# Patient Record
Sex: Female | Born: 2000
Health system: Southern US, Community
[De-identification: ages and names within clinical notes are randomized; demographics above are authoritative.]

---
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
## 2019-10-06 ENCOUNTER — Encounter (HOSPITAL_COMMUNITY): Payer: Self-pay | Admitting: Pharmacy Technician

## 2019-10-06 ENCOUNTER — Emergency Department (HOSPITAL_COMMUNITY)
Admission: EM | Admit: 2019-10-06 | Discharge: 2019-10-06 | Disposition: A | Discharge status: Home / Self Care | Payer: BC Managed Care – PPO | Attending: Emergency Medicine | Admitting: Emergency Medicine

## 2019-10-06 ENCOUNTER — Emergency Department (HOSPITAL_COMMUNITY): Payer: BC Managed Care – PPO

## 2019-10-06 ENCOUNTER — Other Ambulatory Visit: Payer: Self-pay

## 2019-10-06 DIAGNOSIS — S0101XA Laceration without foreign body of scalp, initial encounter: Secondary | ICD-10-CM | POA: Insufficient documentation

## 2019-10-06 DIAGNOSIS — Y92410 Unspecified street and highway as the place of occurrence of the external cause: Secondary | ICD-10-CM | POA: Insufficient documentation

## 2019-10-06 DIAGNOSIS — Y999 Unspecified external cause status: Secondary | ICD-10-CM | POA: Insufficient documentation

## 2019-10-06 DIAGNOSIS — Y9389 Activity, other specified: Secondary | ICD-10-CM | POA: Insufficient documentation

## 2019-10-06 DIAGNOSIS — Z23 Encounter for immunization: Secondary | ICD-10-CM | POA: Diagnosis not present

## 2019-10-06 DIAGNOSIS — R42 Dizziness and giddiness: Secondary | ICD-10-CM | POA: Insufficient documentation

## 2019-10-06 DIAGNOSIS — R519 Headache, unspecified: Secondary | ICD-10-CM | POA: Diagnosis not present

## 2019-10-06 LAB — I-STAT BETA HCG BLOOD, ED (MC, WL, AP ONLY): I-stat hCG, quantitative: <5 m[IU]/mL (ref <5)

## 2019-10-06 MED ORDER — TETANUS-DIPHTH-ACELL PERTUSSIS 5-2.5-18.5 LF-MCG/0.5 IM SUSP
0.5000 mL | Freq: Once | INTRAMUSCULAR | Status: AC
  Administered 2019-10-06: 0.5 mL via INTRAMUSCULAR
  Filled 2019-10-06: qty 0.5

## 2019-10-06 MED ORDER — MORPHINE SULFATE (PF) 2 MG/ML IV SOLN
2.0000 mg | Freq: Once | INTRAVENOUS | Status: AC
  Administered 2019-10-06: 19:00:00 2 mg via INTRAVENOUS
  Filled 2019-10-06: qty 1

## 2019-10-06 MED ORDER — LIDOCAINE-EPINEPHRINE-TETRACAINE (LET) TOPICAL GEL
3.0000 mL | Freq: Once | TOPICAL | Status: AC
  Administered 2019-10-06: 19:00:00 3 mL via TOPICAL
  Filled 2019-10-06: qty 3

## 2019-10-06 NOTE — Discharge Instructions (Addendum)
Per our discussion, please follow-up with your primary care provider or the emergency department for removal of your staples in 1 week.  Please do not hesitate to return to the emergency department with any new or worsening symptoms.  I would recommend applying Neosporin or bacitracin to the wound on her head once to twice a week.  Please be careful when showering and not aggressively scrubbing the region or drying the region.  It was a pleasure to meet you.
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
## 2020-06-16 IMAGING — DX PORTABLE CHEST - 1 VIEW
1 series · 1 of 1 positions shown · non-contrast
Comparison: None.

CLINICAL DATA: 17-year-old female with fever.

EXAM:
PORTABLE CHEST 1 VIEW

[chest]
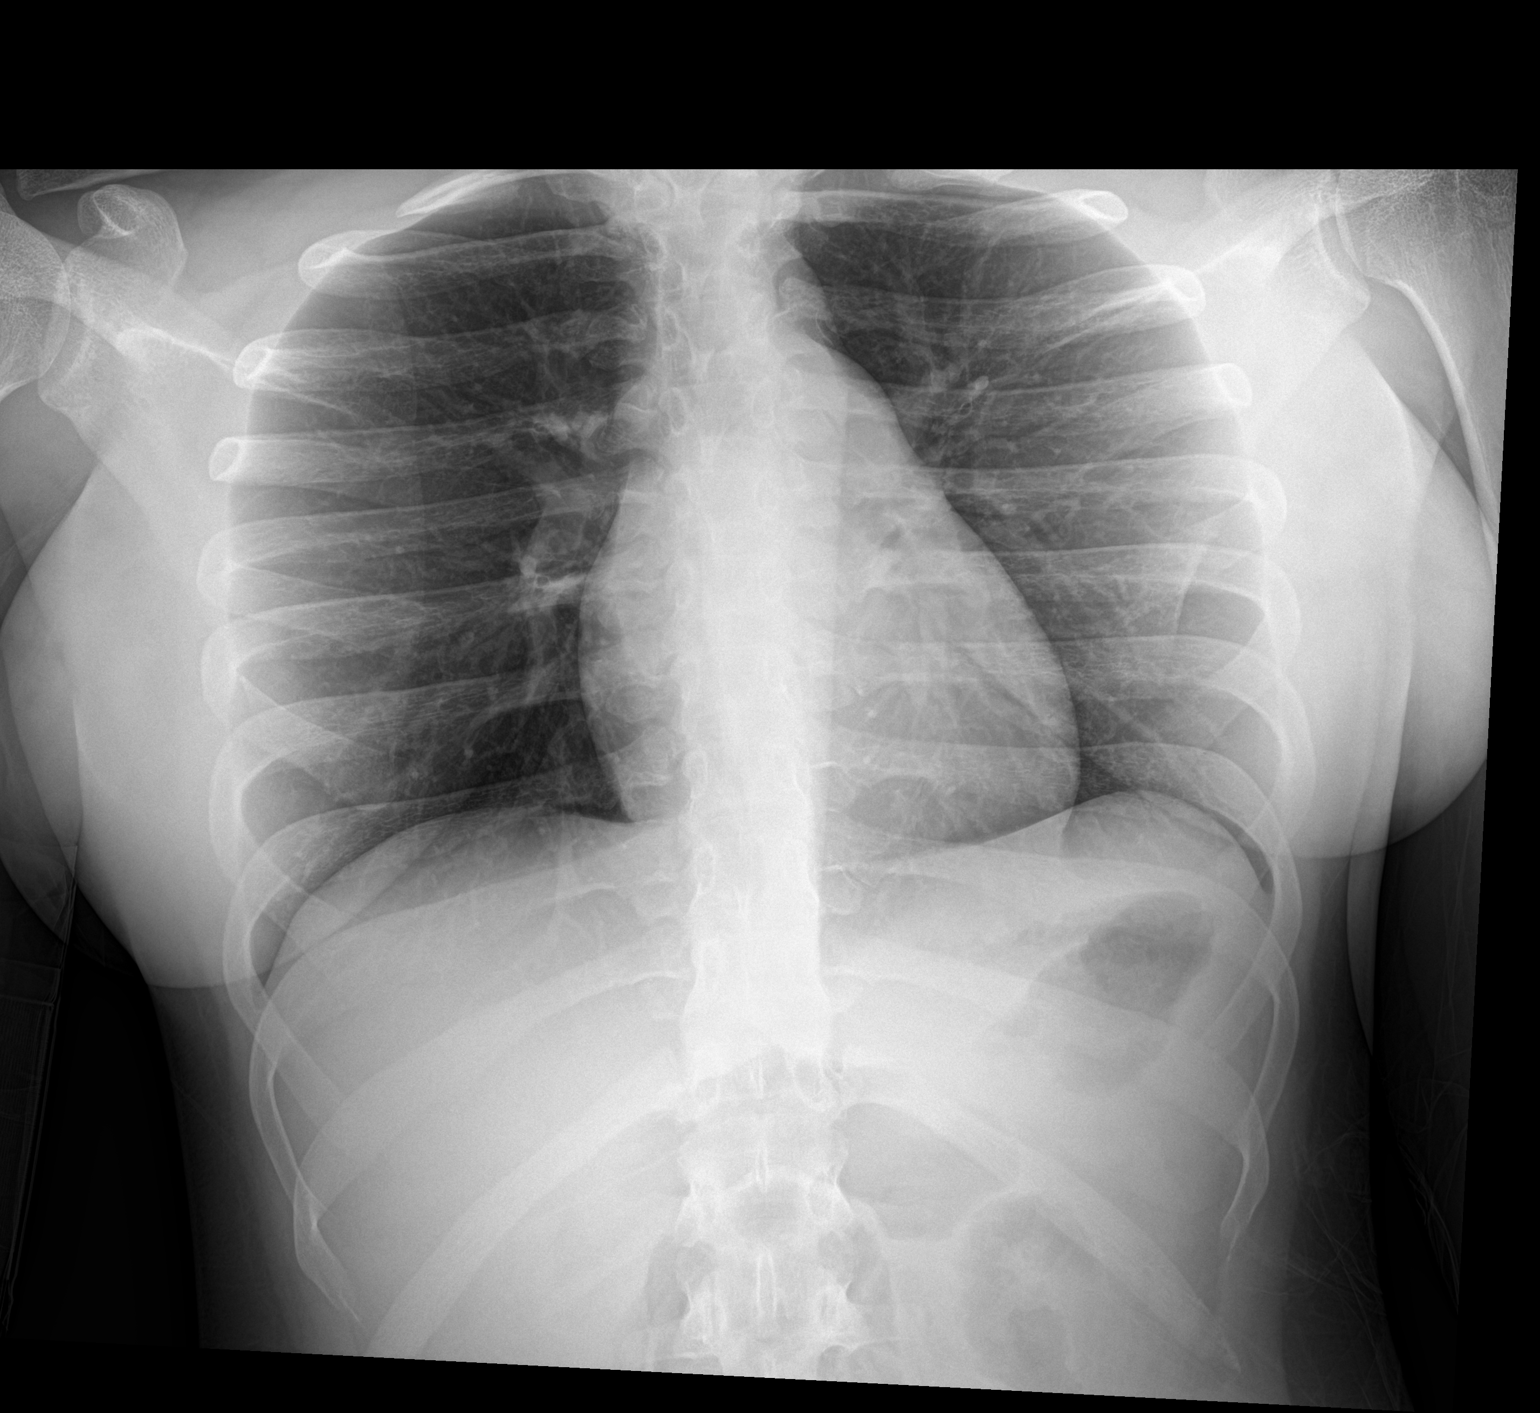

[1 of 1 positions shown; findings below may reference images not displayed]

FINDINGS: The heart size and mediastinal contours are within normal limits.
Both lungs are clear. The visualized skeletal structures are
unremarkable.
IMPRESSION: No active disease.
